# Patient Record
Sex: Male | Born: 1975 | Race: White | Hispanic: No | Marital: Married | Smoking: Never smoker
Health system: Southern US, Community
[De-identification: ages and names within clinical notes are randomized; demographics above are authoritative.]

## PROBLEM LIST (undated history)

## (undated) DIAGNOSIS — J45909 Unspecified asthma, uncomplicated: Secondary | ICD-10-CM

## (undated) HISTORY — DX: Unspecified asthma, uncomplicated: J45.909

---
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
## 1998-10-06 ENCOUNTER — Emergency Department (HOSPITAL_COMMUNITY): Admission: EM | Admit: 1998-10-06 | Discharge: 1998-10-06 | Payer: Self-pay | Admitting: Emergency Medicine

## 2002-11-12 ENCOUNTER — Emergency Department (HOSPITAL_COMMUNITY): Admission: EM | Admit: 2002-11-12 | Discharge: 2002-11-12 | Payer: Self-pay

## 2011-02-23 ENCOUNTER — Other Ambulatory Visit: Payer: Self-pay | Admitting: Sports Medicine

## 2011-02-23 ENCOUNTER — Ambulatory Visit
Admission: RE | Admit: 2011-02-23 | Discharge: 2011-02-23 | Disposition: A | Discharge status: Home / Self Care | Payer: Self-pay | Source: Ambulatory Visit | Attending: Sports Medicine | Admitting: Sports Medicine

## 2011-02-23 DIAGNOSIS — T148XXA Other injury of unspecified body region, initial encounter: Secondary | ICD-10-CM

## 2013-01-04 ENCOUNTER — Ambulatory Visit: Payer: Self-pay | Admitting: Emergency Medicine

## 2013-02-24 ENCOUNTER — Encounter: Payer: Self-pay | Admitting: Sports Medicine

## 2013-02-24 ENCOUNTER — Ambulatory Visit
Admission: RE | Admit: 2013-02-24 | Discharge: 2013-02-24 | Disposition: A | Discharge status: Home / Self Care | Payer: BC Managed Care – PPO | Source: Ambulatory Visit | Attending: Sports Medicine | Admitting: Sports Medicine

## 2013-02-24 ENCOUNTER — Ambulatory Visit (INDEPENDENT_AMBULATORY_CARE_PROVIDER_SITE_OTHER): Payer: BC Managed Care – PPO | Admitting: Sports Medicine

## 2013-02-24 VITALS — BP 133/85 | Ht 71.0 in | Wt 280.0 lb

## 2013-02-24 DIAGNOSIS — M25579 Pain in unspecified ankle and joints of unspecified foot: Secondary | ICD-10-CM

## 2013-02-24 NOTE — Progress Notes (Signed)
Patient ID: Larry Randolph, male   DOB: [birth year], 37 y.o.   MRN: [id number] S: patient is a 37 yo male with history of asthma and left 5th proximal metatarsal fracture who presents with bilateral foot pain. States pain mostly occurs over the dorsum of his feet in the distribution of the 3rd, 4th, and 5th metatarsals. Notes he has pain when he initially rises in the morning and feels the need to stretch out his feet prior to walking on them. They continue to be painful throughout the day and most days reach 10/10. Currently rated at 4/10. Patient notes will occasionally get pain in his heel with this. States activity makes this worse. He endorses history of many ankle sprains and fracture of bilateral 5th metatarsals at various points without surgical intervention. He has previously been evaluated by a podiatrist ~12 years ago and given insoles, which he states did not help. He has used aspirin intermittently without much benefit, no other treatments tried. When asked to point to the most painful area, he points to his left 5th metatarsal. He denies pain in his ankles.  Medical history and current medications are reviewed. No known drug allergies Patient smokes an occasional cigar, drinks alcohol on occasion, and works as a Brewing technologist for [employer]  O:  Left foot and ankle: note no swelling. Has loss of longitudinal and transverse arches. Pronation with gait greater on left. No limp with gait. Nontender to palpation of ankle. Tender to palpation along the 5th metatarsal proximal shaft, also tender over 3rd and 4th metatarsals, non-tender elsewhere, full range of motion at the ankle joint, full range of motion in phalanges, 3+ talar tilt, sensation to light touch intact with 2+ DP pulses  Right foot and ankle: note no swelling. Has loss of longitudinal and transverse arches. Pronation with gait less on right. No limp with gait. Nontender to palpation of ankle. Tender to  palpation over 3rd-5th metatarsals, non-tender elsewhere, full range of motion at the ankle joint, full range of motion in phalanges, 2+ talar tilt, sensation to light touch intact with 2+ DP pulses  Noted on review of left foot and ankle CT scan 11/12012: stress fracture of proximal shaft 5th metatarsal and degenerative changes in his ankle  A/P: Patient is a 37 yo male with a history of left proximal 5th metatarsal fracture who presents with bilateral dorsal foot pain for the past 2 years.   1. Bilateral foot pain: Patient with worsening pain in his feet, most significantly along the left 5th metatarsal in the area of his known stress fracture. In reviewing his previous imaging, he has evidence of degenerative changes in his left ankle, though do not suspect this is contributing given his lack of ankle pain. Of greater concern is the pain in the area of his 5th metatarsal with the previously known fracture. Given lack of previous intervention, the concern is that this area has not healed adequately and is contributing to his current pain. There may be compensatory changes taking place leading to the pain in the 3rd and 4th metatarsals. At this time will obtain bilateral AP and lateral films of feet to reevaluate previously known stress fracture and to evaluate for additional abnormalities.   2. Pes planus: patient with evidence of pes planus on exam. Also with pronation of feet with gait. That may contributing to bilateral foot pain as well. Will provide with soft insert with scaphoid pads for bilateral shoes. Advised that if scaphoid pad was  uncomfortable, he would be able to remove them.  Phone followup after reviewing the x-rays to delineate further treatment.

## 2013-02-25 ENCOUNTER — Telehealth: Payer: Self-pay | Admitting: Sports Medicine

## 2013-02-25 NOTE — Telephone Encounter (Signed)
I spoke with the patient on the phone today after reviewing x-rays of each of his feet. X-rays show well-healed proximal fifth metatarsal fractures. No evidence of anything acute. My recommendation is for the patient to return to the office in 3-4 weeks. He was given a pair of green sports insoles with scaphoid pads at yesterday's visit and he will likely need to have a pair of custom orthotics constructed at his followup visit. I will also plan on educating him in some basic exercises for his ankle and we may also want to give him some extra support in the form of a body helix compression sleeve.

## 2013-03-24 ENCOUNTER — Ambulatory Visit (INDEPENDENT_AMBULATORY_CARE_PROVIDER_SITE_OTHER): Payer: BC Managed Care – PPO | Admitting: Sports Medicine

## 2013-03-24 ENCOUNTER — Encounter: Payer: Self-pay | Admitting: Sports Medicine

## 2013-03-24 VITALS — BP 125/80 | Ht 71.0 in | Wt 273.0 lb

## 2013-03-24 DIAGNOSIS — M25579 Pain in unspecified ankle and joints of unspecified foot: Secondary | ICD-10-CM

## 2013-03-24 NOTE — Progress Notes (Signed)
Patient ID: Larry Randolph, male   DOB: 1975/07/27, 37 y.o.   MRN: [id number]  Patient comes in today for custom orthotics. Please see previous office notes for details regarding history and physical exam.  Patient was fitted for a : standard, cushioned, semi-rigid orthotic. The orthotic was heated and afterward the patient stood on the orthotic blank positioned on the orthotic stand. The patient was positioned in subtalar neutral position and 10 degrees of ankle dorsiflexion in a weight bearing stance. After completion of molding, a stable base was applied to the orthotic blank. The blank was ground to a stable position for weight bearing. Size: 11 Base: Blue EVA Posting: none Additional orthotic padding: none  A total of 30 minutes was spent with the patient with greater than 50% of the time spent in face to face orthotic preparation, consultation, and fitting. Patient found orthotics to be comfortable

## 2015-01-28 IMAGING — CR DG FOOT 2V*L*
2 series · 2 of 2 positions shown · non-contrast
Comparison: None.

CLINICAL DATA: Chronic bilateral foot pain, left greater than right

EXAM:
LEFT FOOT - 2 VIEW

[view not recorded (1 of 2)]
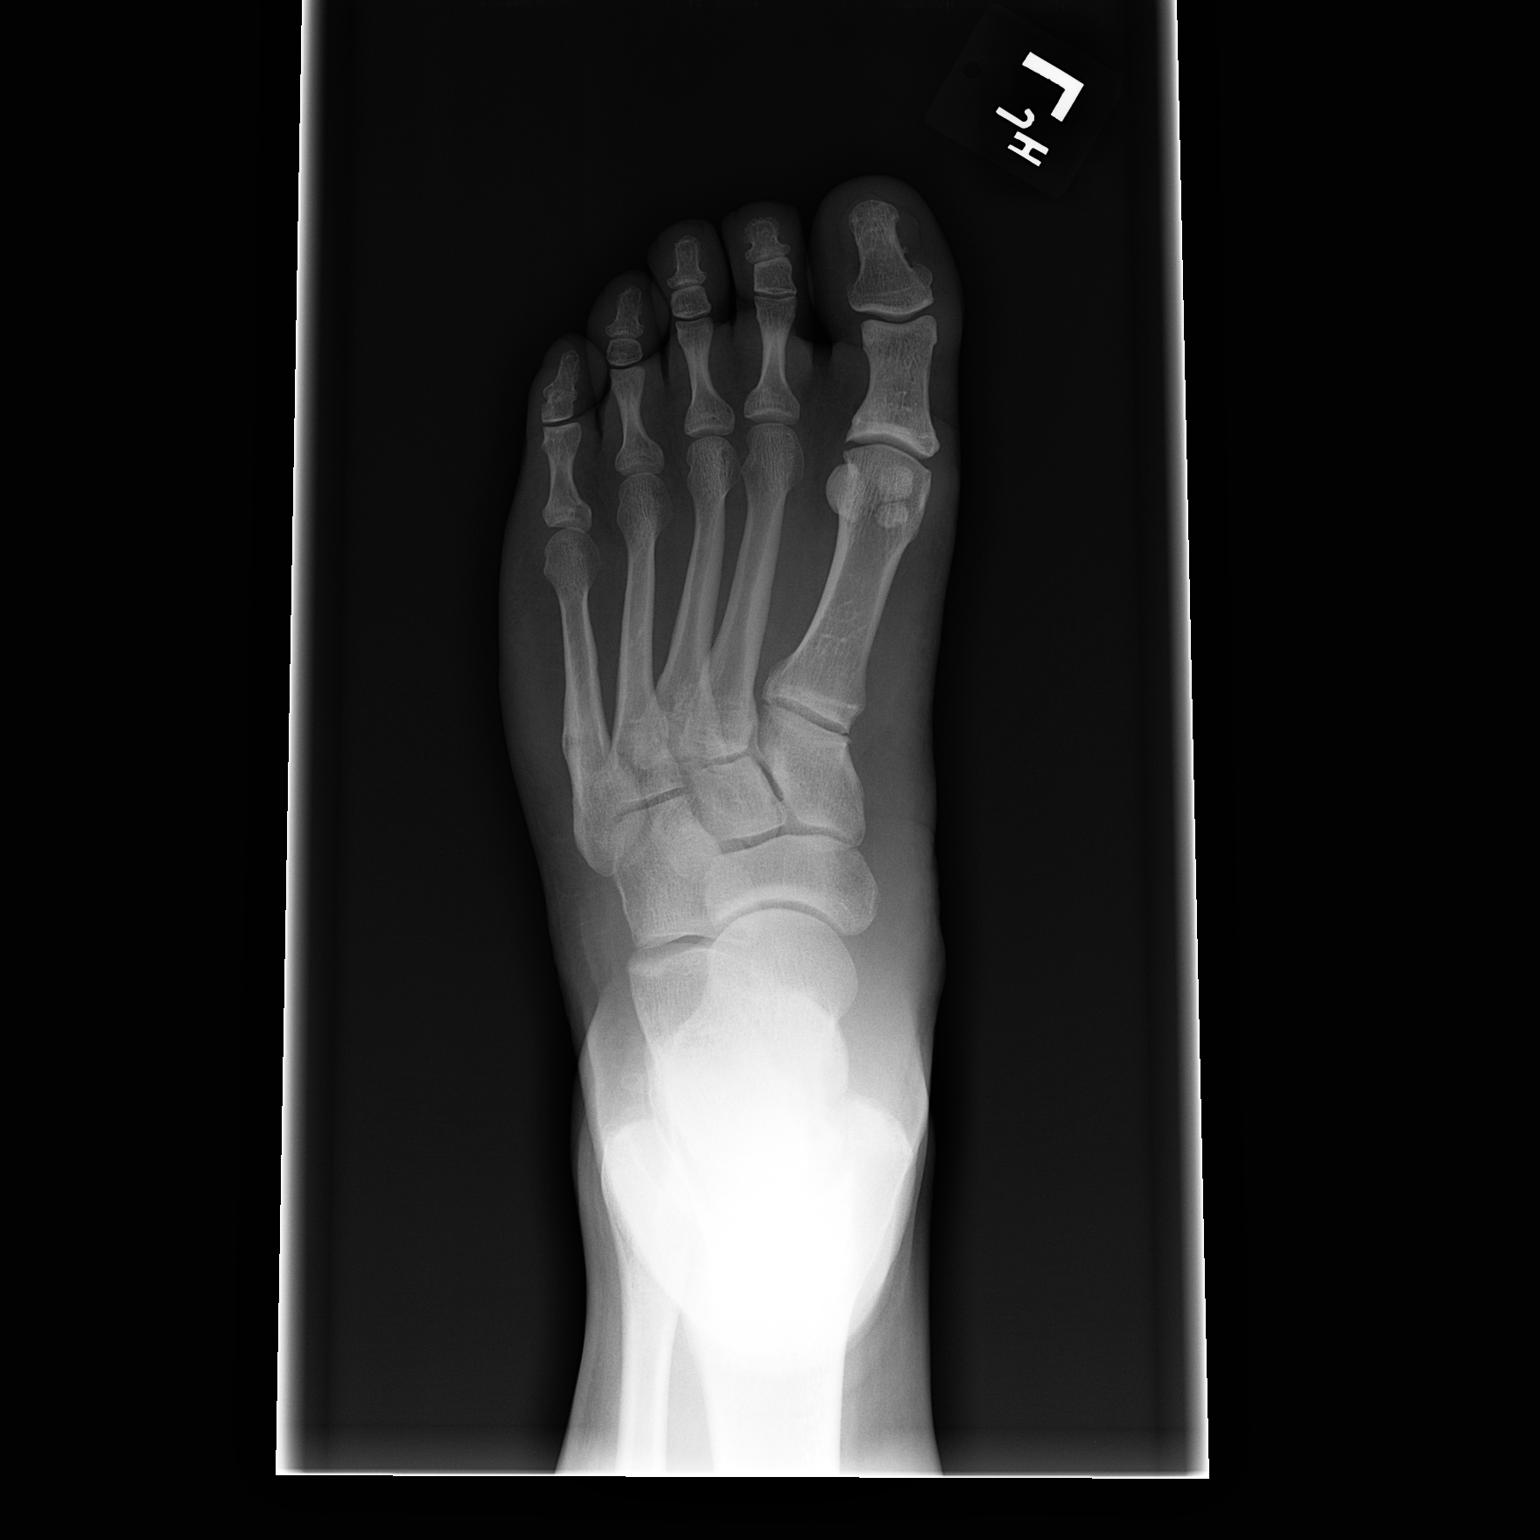

[view not recorded (2 of 2)]
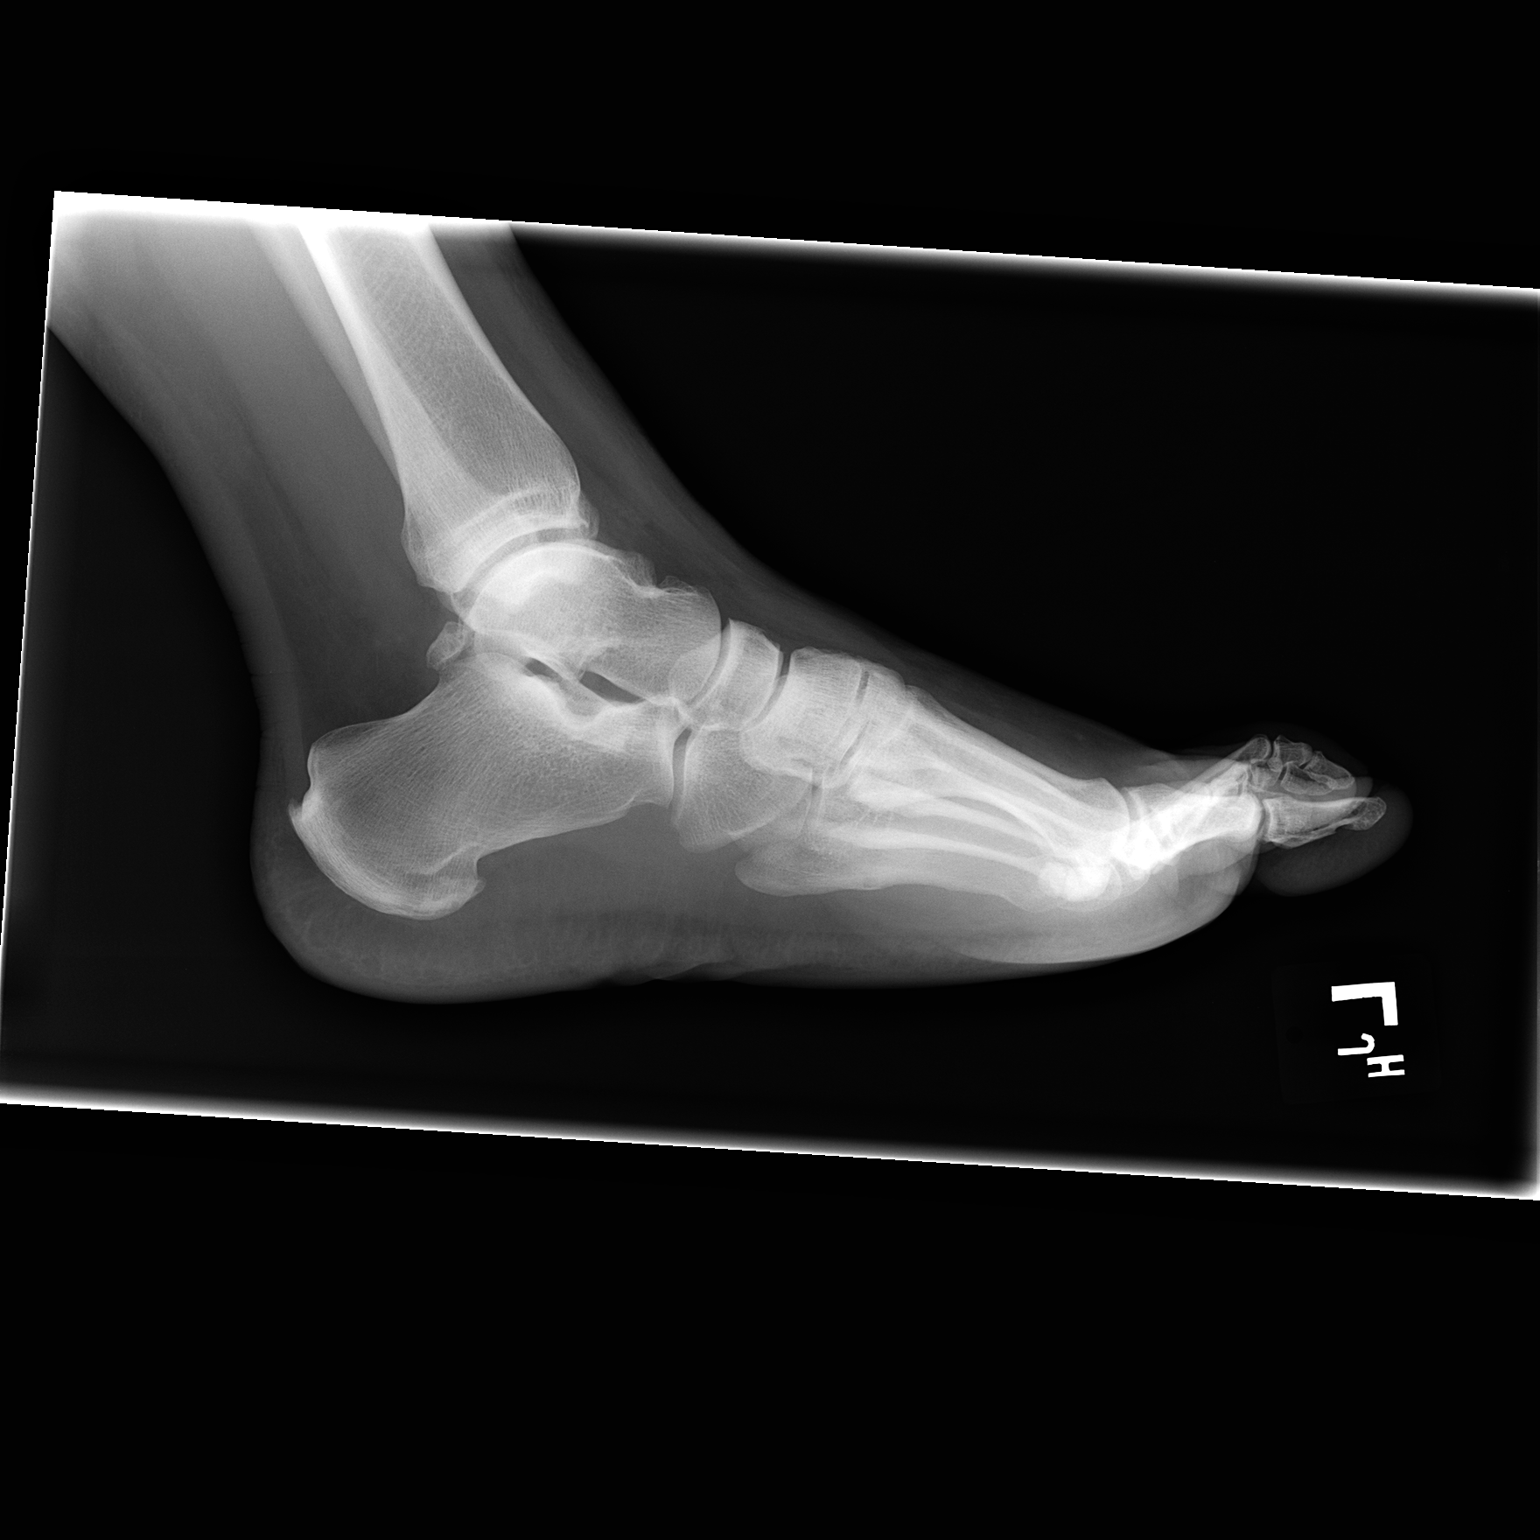

[2 of 2 positions shown; findings below may reference images not displayed]

FINDINGS: No evidence of acute fracture dislocation.

Old/healed fracture of the proximal 5th metatarsal.

Mild degenerative changes of the 1st MTP joint.

Tiny plantar posterior calcaneal enthesophytes. Mild degenerative
changes of the tibiotalar joint.

Visualized soft tissues are unremarkable.
IMPRESSION: Old/healed fracture of the proximal 5th metatarsal.

Mild degenerative changes.

## 2022-02-05 ENCOUNTER — Ambulatory Visit
Admission: EM | Admit: 2022-02-05 | Discharge: 2022-02-05 | Disposition: A | Discharge status: Home / Self Care | Payer: BC Managed Care – PPO

## 2022-02-05 ENCOUNTER — Encounter: Payer: Self-pay | Admitting: Emergency Medicine

## 2022-02-05 DIAGNOSIS — S161XXA Strain of muscle, fascia and tendon at neck level, initial encounter: Secondary | ICD-10-CM

## 2022-02-05 MED ORDER — KETOROLAC TROMETHAMINE 60 MG/2ML IM SOLN
30.0000 mg | Freq: Once | INTRAMUSCULAR | Status: AC
  Administered 2022-02-05: 30 mg via INTRAMUSCULAR

## 2022-02-05 MED ORDER — CYCLOBENZAPRINE HCL 10 MG PO TABS: 10.0000 mg | ORAL_TABLET | Freq: Two times a day (BID) | ORAL | 0 refills | Status: DC | PRN

## 2022-02-05 MED ORDER — MELOXICAM 7.5 MG PO TABS: 7.5000 mg | ORAL_TABLET | Freq: Every day | ORAL | 0 refills | Status: DC

## 2022-02-05 NOTE — ED Triage Notes (Signed)
Patient c/o neck pain and spasms that started late yesterday.  Patient denies any injury or fall.  Patient denies fevers.  Patient has not taken any OTC medicine for pain this morning.

## 2022-02-05 NOTE — ED Provider Notes (Signed)
[facility]    CSN: [id number] Arrival date & time: 02/05/22  0817      History   Chief Complaint Chief Complaint  Patient presents with   Neck Pain    HPI Larry Randolph is a 46 y.o. male.   Patient presents with right-sided neck pain described as spasming in tension beginning 1 day ago.  Associated tingling.  Pain does not radiate.  Pain is worsened when turning head from side to side and up-and-down.  Some of the tension is released when head is stretched towards the left side.  Has not attempted treatment of symptoms.  Denies injury or trauma.  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   Past Medical History:  Diagnosis Date   Asthma     There are no problems to display for this patient.   History reviewed. No pertinent surgical history.     Home Medications    Prior to Admission medications   Medication Sig Start Date End Date Taking? Authorizing Provider  ADVAIR DISKUS 250-50 MCG/ACT AEPB SMARTSIG:1 unspecified By Mouth Every 12 Hours 01/25/22  Yes [provider]  loratadine (CLARITIN) 10 MG tablet Take by mouth.    [provider]  meloxicam (MOBIC) 15 MG tablet  01/05/13   [provider]  montelukast (SINGULAIR) 10 MG tablet  01/28/13   [provider]  Multiple Vitamin (MULTI-VITAMIN) tablet Take 1 tablet by mouth daily.    [provider]  PROAIR HFA 108 (90 BASE) MCG/ACT inhaler  12/29/12   [provider]    Family History History reviewed. No pertinent family history.  Social History Social History   Tobacco Use   Smoking status: Never   Smokeless tobacco: Never  Vaping Use   Vaping Use: Never used  Substance Use Topics   Alcohol use: Yes   Drug use: Never     Allergies   Patient has no known allergies.   Review of Systems Review of Systems  Constitutional: Negative.   Cardiovascular: Negative.   Musculoskeletal:  Positive for neck pain. Negative for arthralgias, back  pain, gait problem, joint swelling, myalgias and neck stiffness.  Skin: Negative.   Neurological: Negative.      Physical Exam Triage Vital Signs ED Triage Vitals  Enc Vitals Group     BP 02/05/22 0855 115/82     Pulse Rate 02/05/22 0855 64     Resp 02/05/22 0855 15     Temp 02/05/22 0855 97.8 F (36.6 C)     Temp Source 02/05/22 0855 Oral     SpO2 02/05/22 0855 97 %     Weight 02/05/22 0852 255 lb (115.7 kg)     Height 02/05/22 0852 5\' 11"  (1.803 m)     Head Circumference --      Peak Flow --      Pain Score 02/05/22 0851 7     Pain Loc --      Pain Edu? --      Excl. in GC? --    No data found.  Updated Vital Signs BP 115/82 (BP Location: Left Arm)   Pulse 64   Temp 97.8 F (36.6 C) (Oral)   Resp 15   Ht 5\' 11"  (1.803 m)   Wt 255 lb (115.7 kg)   SpO2 97%   BMI 35.57 kg/m   Visual Acuity Right Eye Distance:   Left Eye Distance:   Bilateral Distance:    Right Eye Near:   Left Eye Near:  Bilateral Near:     Physical Exam Constitutional:      Appearance: Normal appearance.  HENT:     Head: Normocephalic.  Eyes:     Extraocular Movements: Extraocular movements intact.  Neck:     Comments: Unable to reproduce tenderness on exam, no ecchymosis, swelling or deformity, range of motion is intact but elicits pain with all movement, strength is a 5 out of 5, no rigidity or crepitus present, no abnormalities to the thyroid, 2+ carotid pulses Pulmonary:     Effort: Pulmonary effort is normal.  Skin:    General: Skin is warm and dry.  Neurological:     Mental Status: He is alert and oriented to person, place, and time. Mental status is at baseline.  Psychiatric:        Mood and Affect: Mood normal.      UC Treatments / Results  Labs (all labs ordered are listed, but only abnormal results are displayed) Labs Reviewed - No data to display  EKG   Radiology No results found.  Procedures Procedures (including critical care time)  Medications Ordered  in UC Medications - No data to display  Initial Impression / Assessment and Plan / UC Course  I have reviewed the triage vital signs and the nursing notes.  Pertinent labs & imaging results that were available during my care of the patient were reviewed by me and considered in my medical decision making (see chart for details).  Neck strain, initial encounter  Etiology appears to be muscular, discussed with patient no spinal tenderness noted on exam, will defer imaging at this time, Toradol injection given in office and prescribed meloxicam and Flexeril for outpatient use, recommended RICE, heat, massage, stretching and daily activity as tolerated, given a referral to orthopedics if symptoms persist or worsen Final Clinical Impressions(s) / UC Diagnoses   Final diagnoses:  None   Discharge Instructions   None    ED Prescriptions   None    PDMP not reviewed this encounter.   Hans Eden, [place] 02/05/22 [phone]

## 2022-02-05 NOTE — Discharge Instructions (Signed)
Your pain is most likely caused by irritation to the muscles .  You have been given an injection of Toradol today here in the office to help relieve inflammation that naturally occurs with injury which in turn should help with your pain  Starting tomorrow you may take meloxicam every morning for 5 days then as needed to continue the process above  You may use muscle relaxer twice daily as needed for additional comfort, be mindful this medication may make you drowsy  You may use heating pad in 15 minute intervals as needed for additional comfort, within the first 2-3 days you may find comfort in using ice in 10-15 minutes over affected area  Begin stretching or massage affected area daily for 10 minutes as tolerated to further loosen muscles   When lying down place pillow behind neck  Can try sleeping without pillow on firm mattress   Practice good posture: head back, shoulders back, chest forward, pelvis back and weight distributed evenly on both legs  If pain persist after recommended treatment or reoccurs if may be beneficial to follow up with orthopedic specialist for evaluation, this doctor specializes in the bones and can manage your symptoms long-term with options such as but not limited to imaging, medications or physical therapy

## 2023-05-08 ENCOUNTER — Ambulatory Visit: Payer: Self-pay | Admitting: Urology

## 2023-05-09 ENCOUNTER — Ambulatory Visit: Payer: 59 | Admitting: Urology

## 2023-05-09 VITALS — BP 121/81 | HR 83 | Ht 71.0 in | Wt 283.4 lb

## 2023-05-09 DIAGNOSIS — E669 Obesity, unspecified: Secondary | ICD-10-CM | POA: Insufficient documentation

## 2023-05-09 DIAGNOSIS — G43909 Migraine, unspecified, not intractable, without status migrainosus: Secondary | ICD-10-CM | POA: Insufficient documentation

## 2023-05-09 DIAGNOSIS — E291 Testicular hypofunction: Secondary | ICD-10-CM | POA: Diagnosis not present

## 2023-05-09 DIAGNOSIS — J309 Allergic rhinitis, unspecified: Secondary | ICD-10-CM | POA: Insufficient documentation

## 2023-05-09 DIAGNOSIS — L659 Nonscarring hair loss, unspecified: Secondary | ICD-10-CM | POA: Diagnosis not present

## 2023-05-09 DIAGNOSIS — F419 Anxiety disorder, unspecified: Secondary | ICD-10-CM | POA: Insufficient documentation

## 2023-05-09 MED ORDER — CLOMIPHENE CITRATE 50 MG PO TABS: 25.0000 mg | ORAL_TABLET | Freq: Every day | ORAL | 2 refills | Status: DC

## 2023-05-09 MED ORDER — FINASTERIDE 1 MG PO TABS: 1.0000 mg | ORAL_TABLET | Freq: Every day | ORAL | 11 refills | Status: DC

## 2023-05-09 NOTE — Progress Notes (Signed)
Marcelle Overlie Plume,acting as a scribe for Vanna Scotland, MD.,have documented all relevant documentation on the behalf of Vanna Scotland, MD,as directed by  Vanna Scotland, MD while in the presence of Vanna Scotland, MD.  05/09/2023 04:15 PM  Larry Randolph 24-Jul-1975 [id number]  Referring provider: Marina Goodell, MD [address] [place],  [place] [address]  Chief Complaint  Patient presents with   Establish Care   Hypogonadism    HPI: 48 year-old male who presents today for further evaluation of hypogonadism.   His primary care is Dr. Maryjane Hurter. He mentioned feeling chronic fatigue and as part of his workup, his testosterone was checked.   In 03/2023, his testosterone was 373 with a free testosterone of 5.6. This has been checked numerous times in the past and has primarily been in the 400 range.   His most recent PSA on 03/21/2023 was 0.56.   He also reports severe erectile dysfunction. He experiences a lack of sex drive and thinning hair, which is unusual given no family history of baldness.  He has been experiencing cramps, primarily in the hands and occasionally in the legs, despite normal potassium levels. He denies depression but reports occasional days of feeling down without a clear reason. Sleep is generally adequate, with occasional awakenings during the night.He has gained weight over the past few years, increasing from 230 to 280 pounds, despite maintaining an active lifestyle with jiu-jitsu and gym workouts.   SHIM     Row Name 05/09/23 1606         SHIM: Over the last 6 months:   How do you rate your confidence that you could get and keep an erection? Very Low     When you had erections with sexual stimulation, how often were your erections hard enough for penetration (entering your partner)? Almost Never or Never     During sexual intercourse, how often were you able to maintain your erection after you had penetrated (entered) your partner? Almost Never  or Never     During sexual intercourse, how difficult was it to maintain your erection to completion of intercourse? Extremely Difficult     When you attempted sexual intercourse, how often was it satisfactory for you? Almost Never or Never       SHIM Total Score   SHIM 5              Androgen Deficiency in the Aging Male     Row Name 05/09/23 1600         Androgen Deficiency in the Aging Male   Do you have a decrease in libido (sex drive) Yes     Do you have lack of energy Yes     Do you have a decrease in strength and/or endurance Yes     Have you lost height No     Have you noticed a decreased "enjoyment of life" Yes     Are you sad and/or grumpy Yes     Are your erections less strong Yes     Have you noticed a recent deterioration in your ability to play sports Yes     Are you falling asleep after dinner Yes     Has there been a recent deterioration in your work performance Yes               PMH: Past Medical History:  Diagnosis Date   Asthma     Surgical History: No past surgical history on file.  Home Medications:  Allergies as of 05/09/2023       Reactions   Cat Dander Itching, Shortness Of Breath, Swelling   Dust Mite Extract Itching, Other (See Comments), Shortness Of Breath        Medication List        Accurate as of May 09, 2023 11:59 PM. If you have any questions, ask your nurse or doctor.          STOP taking these medications    cyclobenzaprine 10 MG tablet Commonly known as: FLEXERIL   loratadine 10 MG tablet Commonly known as: CLARITIN   meloxicam 7.5 MG tablet Commonly known as: Mobic   montelukast 10 MG tablet Commonly known as: SINGULAIR   Multi-Vitamin tablet       TAKE these medications    Advair Diskus 250-50 MCG/ACT Aepb Generic drug: fluticasone-salmeterol SMARTSIG:1 unspecified By Mouth Every 12 Hours   clomiPHENE 50 MG tablet Commonly known as: CLOMID Take 0.5 tablets (25 mg total) by mouth  daily.   finasteride 1 MG tablet Commonly known as: PROPECIA Take 1 tablet (1 mg total) by mouth daily.   Iron 18 MG/15ML Liqd   MAGNESIUM COMPLEX HIGH POTENCY PO   ProAir HFA 108 (90 Base) MCG/ACT inhaler Generic drug: albuterol   QC Tumeric Complex 500 MG Caps Generic drug: Turmeric   vitamin C 1000 MG tablet Take 1,000 mg by mouth daily.   Vitamin D3 125 MCG/0.5ML Liqd        Allergies:  Allergies  Allergen Reactions   Cat Dander Itching, Shortness Of Breath and Swelling   Dust Mite Extract Itching, Other (See Comments) and Shortness Of Breath     Social History:  reports that he has never smoked. He has never used smokeless tobacco. He reports current alcohol use. He reports that he does not use drugs.   Physical Exam: BP 121/81   Pulse 83   Ht 5\' 11"  (1.803 m)   Wt 283 lb 6 oz (128.5 kg)   BMI 39.52 kg/m   Constitutional:  Alert and oriented, No acute distress. HEENT: [place] AT, moist mucus membranes.  Trachea midline, no masses. Neurologic: Grossly intact, no focal deficits, moving all 4 extremities. Psychiatric: Normal mood and affect.   Assessment & Plan:    1. Hypogonadism - Testosterone levels are within the normal range, but on the lower end.  - Consider starting Clomid 25 mg daily to stimulate testosterone production.  - Re-evaluate testosterone levels and symptoms in three months. - Discussed the limitations of testosterone replacement therapy due to insurance coverage and potential side effects.  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  - Monitor for any improvement in hair growth over the next few months.  3. Erectile dysfunction - He completed a SHIM that suggested that he experienced this, but denied it in the clinic, so we deferred that conversation  Return in about 3 months (around 08/07/2023) for reevaluation of testosterone levels, LFTs, and assessment of symptom improvement.  I have  reviewed the above documentation for accuracy and completeness, and I agree with the above.   Vanna Scotland, MD   [facility] [address], [address] [place], [place] [address] [phone]

## 2023-05-15 ENCOUNTER — Telehealth: Payer: Self-pay | Admitting: Urology

## 2023-05-15 DIAGNOSIS — E291 Testicular hypofunction: Secondary | ICD-10-CM

## 2023-05-15 DIAGNOSIS — L659 Nonscarring hair loss, unspecified: Secondary | ICD-10-CM

## 2023-05-15 MED ORDER — FINASTERIDE 1 MG PO TABS: 1.0000 mg | ORAL_TABLET | Freq: Every day | ORAL | 11 refills | Status: AC

## 2023-05-15 MED ORDER — CLOMIPHENE CITRATE 50 MG PO TABS: 25.0000 mg | ORAL_TABLET | Freq: Every day | ORAL | 2 refills | Status: AC

## 2023-05-15 NOTE — Telephone Encounter (Signed)
Pt had meds sent to [facility] in [place].  For insurance purposes he would like to have them changed to [facility] in [place].  Clomiphene and Finasteride.

## 2023-05-15 NOTE — Telephone Encounter (Signed)
Medication re sent to [facility] as requested

## 2023-05-30 ENCOUNTER — Ambulatory Visit: Payer: Self-pay | Admitting: Urology

## 2023-08-02 ENCOUNTER — Other Ambulatory Visit: Payer: Self-pay

## 2023-08-07 ENCOUNTER — Ambulatory Visit: Payer: Self-pay | Admitting: Urology
